# Patient Record
Sex: Male | Born: 1980 | Race: Black or African American | Hispanic: No | Marital: Married | Smoking: Current some day smoker
Health system: Southern US, Community
[De-identification: ages and names within clinical notes are randomized; demographics above are authoritative.]

---

## 2019-07-23 ENCOUNTER — Encounter: Payer: Self-pay | Admitting: Internal Medicine

## 2019-07-23 ENCOUNTER — Ambulatory Visit (INDEPENDENT_AMBULATORY_CARE_PROVIDER_SITE_OTHER): Payer: Self-pay | Admitting: Internal Medicine

## 2019-07-23 DIAGNOSIS — R61 Generalized hyperhidrosis: Secondary | ICD-10-CM

## 2019-07-23 DIAGNOSIS — Z7689 Persons encountering health services in other specified circumstances: Secondary | ICD-10-CM

## 2019-07-23 NOTE — Progress Notes (Signed)
Virtual Visit via Telephone Note  I connected with Cody Downs, on 07/23/2019 at 8:55 AM by telephone due to the COVID-19 pandemic and verified that I am speaking with the correct person using two identifiers.   Consent: I discussed the limitations, risks, security and privacy concerns of performing an evaluation and management service by telephone and the availability of in person appointments. I also discussed with the patient that there may be a patient responsible charge related to this service. The patient expressed understanding and agreed to proceed.   Location of Patient: Home   Location of Provider: Clinic    Persons participating in Telemedicine visit: Daren Jariel [facility] Dr. Earlene Plater      History of Present Illness: Patient has a visit to establish care.   Reports that he is waking up in cold sweats. This has been going on since mid Feb. Occurred for three-four weeks then stopped. Started again every once in a while. He has to change his clothes. Has lost 15 lbs since Dec. Had a negative COVID test at the end of Feb. Unsure if he has had fevers. Denies use of OTC or prescription medications. He smokes THC but denies other alcohol or drug use. Denies headaches, flushing, sensation of heart racing. Denies history of high BP. No recent travel outside of the country. Has not left state of [place] since Dec.     No past medical history on file. No Known Allergies  No current outpatient medications on file prior to visit.   No current facility-administered medications on file prior to visit.    Observations/Objective: NAD. Speaking clearly.  Work of breathing normal.  Alert and oriented. Mood appropriate.   Assessment and Plan: 1. Encounter to establish care  2. Night sweats With reported weight loss over past 3 months. Do not have any documented weights for objective data. Will obtain lab work to evaluate for TB, HIV, Hyperthyroidism, and any signs of  malignancy. Patient denies medication use and no illicit drug use that could potentially cause withdrawal symptoms.  No other symptoms concerning for pheochromocytoma or carcinoid syndrome. If lab work unremarkable, will plan to follow weight loss and severity of night sweats. Also needs physical exam particularly to evaluate for any LAD.  - C-reactive protein; Future - Urinalysis, Routine w reflex microscopic; Future - Sedimentation Rate; Future - CBC; Future - Comprehensive metabolic panel; Future - TSH; Future - HIV antibody (with reflex); Future - QuantiFERON-TB Gold Plus; Future   Follow Up Instructions: Lab visit 3/31   I discussed the assessment and treatment plan with the patient. The patient was provided an opportunity to ask questions and all were answered. The patient agreed with the plan and demonstrated an understanding of the instructions.   The patient was advised to call back or seek an in-person evaluation if the symptoms worsen or if the condition fails to improve as anticipated.     I provided 26 minutes total of non-face-to-face time during this encounter including median intraservice time, reviewing previous notes, investigations, ordering medications, medical decision making, coordinating care and patient verbalized understanding at the end of the visit.    Marcy Siren, D.O. Primary Care at [facility]  07/23/2019, 8:55 AM

## 2019-07-24 ENCOUNTER — Other Ambulatory Visit: Payer: Self-pay

## 2019-07-24 DIAGNOSIS — R61 Generalized hyperhidrosis: Secondary | ICD-10-CM

## 2019-07-24 NOTE — Progress Notes (Signed)
Patient here for labs ordered during phone visit.

## 2019-07-25 LAB — SEDIMENTATION RATE: Sed Rate: 93 mm/hr — ABNORMAL HIGH (ref 0–15)

## 2019-07-26 LAB — COMPREHENSIVE METABOLIC PANEL
ALT: 16 IU/L (ref 0–44)
AST: 17 IU/L (ref 0–40)
Albumin/Globulin Ratio: 1.7 (ref 1.2–2.2)
Albumin: 4.5 g/dL (ref 4.0–5.0)
Alkaline Phosphatase: 98 IU/L (ref 39–117)
BUN/Creatinine Ratio: 13 (ref 9–20)
BUN: 12 mg/dL (ref 6–20)
Bilirubin Total: 0.4 mg/dL (ref 0.0–1.2)
CO2: 22 mmol/L (ref 20–29)
Calcium: 9.5 mg/dL (ref 8.7–10.2)
Chloride: 99 mmol/L (ref 96–106)
Creatinine, Ser: 0.89 mg/dL (ref 0.76–1.27)
GFR calc Af Amer: 127 mL/min/{1.73_m2} (ref >59)
GFR calc non Af Amer: 110 mL/min/{1.73_m2} (ref >59)
Globulin, Total: 2.7 g/dL (ref 1.5–4.5)
Glucose: 78 mg/dL (ref 65–99)
Potassium: 3.8 mmol/L (ref 3.5–5.2)
Sodium: 137 mmol/L (ref 134–144)
Total Protein: 7.2 g/dL (ref 6.0–8.5)

## 2019-07-26 LAB — QUANTIFERON-TB GOLD PLUS
QuantiFERON Mitogen Value: >10 IU/mL
QuantiFERON Nil Value: 0.39 IU/mL
QuantiFERON TB1 Ag Value: 9.87 IU/mL
QuantiFERON TB2 Ag Value: >10 IU/mL
QuantiFERON-TB Gold Plus: POSITIVE — AB

## 2019-07-26 LAB — C-REACTIVE PROTEIN: CRP: 29 mg/L — ABNORMAL HIGH (ref 0–10)

## 2019-07-26 LAB — TSH: TSH: 0.525 u[IU]/mL (ref 0.450–4.500)

## 2019-07-26 LAB — HIV ANTIBODY (ROUTINE TESTING W REFLEX): HIV Screen 4th Generation wRfx: NONREACTIVE

## 2019-07-30 ENCOUNTER — Encounter: Payer: Self-pay | Admitting: Internal Medicine

## 2019-07-30 DIAGNOSIS — R7612 Nonspecific reaction to cell mediated immunity measurement of gamma interferon antigen response without active tuberculosis: Secondary | ICD-10-CM | POA: Insufficient documentation

## 2019-07-30 NOTE — Progress Notes (Signed)
Patient notified of results & recommendations. Expressed understanding. Aware that he will be contacted by the Health Department to schedule his chest xray & sputum collection. Faxed office notes, labs & demographics to the TB department at [facility])

## 2020-05-29 ENCOUNTER — Other Ambulatory Visit: Payer: Self-pay

## 2020-05-29 ENCOUNTER — Encounter (HOSPITAL_COMMUNITY): Payer: Self-pay | Admitting: Emergency Medicine

## 2020-05-29 ENCOUNTER — Emergency Department (HOSPITAL_COMMUNITY)
Admission: EM | Admit: 2020-05-29 | Discharge: 2020-05-29 | Disposition: A | Discharge status: Home / Self Care | Payer: HRSA Program | Attending: Emergency Medicine | Admitting: Emergency Medicine

## 2020-05-29 ENCOUNTER — Emergency Department (HOSPITAL_COMMUNITY): Payer: HRSA Program

## 2020-05-29 DIAGNOSIS — R112 Nausea with vomiting, unspecified: Secondary | ICD-10-CM | POA: Diagnosis present

## 2020-05-29 DIAGNOSIS — U071 COVID-19: Secondary | ICD-10-CM | POA: Diagnosis not present

## 2020-05-29 DIAGNOSIS — F17200 Nicotine dependence, unspecified, uncomplicated: Secondary | ICD-10-CM | POA: Diagnosis not present

## 2020-05-29 DIAGNOSIS — R079 Chest pain, unspecified: Secondary | ICD-10-CM | POA: Insufficient documentation

## 2020-05-29 DIAGNOSIS — R1010 Upper abdominal pain, unspecified: Secondary | ICD-10-CM | POA: Insufficient documentation

## 2020-05-29 DIAGNOSIS — R7401 Elevation of levels of liver transaminase levels: Secondary | ICD-10-CM | POA: Diagnosis not present

## 2020-05-29 LAB — URINALYSIS, ROUTINE W REFLEX MICROSCOPIC
Bacteria, UA: NONE SEEN
Bilirubin Urine: NEGATIVE
Glucose, UA: NEGATIVE mg/dL
Hgb urine dipstick: NEGATIVE
Ketones, ur: NEGATIVE mg/dL
Leukocytes,Ua: NEGATIVE
Nitrite: NEGATIVE
Protein, ur: 30 mg/dL — AB
Specific Gravity, Urine: 1.032 — ABNORMAL HIGH (ref 1.005–1.030)
pH: 8 (ref 5.0–8.0)

## 2020-05-29 LAB — COMPREHENSIVE METABOLIC PANEL
ALT: 93 U/L — ABNORMAL HIGH (ref 0–44)
AST: 61 U/L — ABNORMAL HIGH (ref 15–41)
Albumin: 4 g/dL (ref 3.5–5.0)
Alkaline Phosphatase: 64 U/L (ref 38–126)
Anion gap: 13 (ref 5–15)
BUN: 10 mg/dL (ref 6–20)
CO2: 21 mmol/L — ABNORMAL LOW (ref 22–32)
Calcium: 9.3 mg/dL (ref 8.9–10.3)
Chloride: 100 mmol/L (ref 98–111)
Creatinine, Ser: 1.01 mg/dL (ref 0.61–1.24)
GFR, Estimated: >60 mL/min (ref >60)
Glucose, Bld: 151 mg/dL — ABNORMAL HIGH (ref 70–99)
Potassium: 3.7 mmol/L (ref 3.5–5.1)
Sodium: 134 mmol/L — ABNORMAL LOW (ref 135–145)
Total Bilirubin: 0.6 mg/dL (ref 0.3–1.2)
Total Protein: 7 g/dL (ref 6.5–8.1)

## 2020-05-29 LAB — SARS CORONAVIRUS 2 (TAT 6-24 HRS): SARS Coronavirus 2: POSITIVE — AB

## 2020-05-29 LAB — CBC
HCT: 44.2 % (ref 39.0–52.0)
Hemoglobin: 13.7 g/dL (ref 13.0–17.0)
MCH: 23.3 pg — ABNORMAL LOW (ref 26.0–34.0)
MCHC: 31 g/dL (ref 30.0–36.0)
MCV: 75.3 fL — ABNORMAL LOW (ref 80.0–100.0)
Platelets: 279 10*3/uL (ref 150–400)
RBC: 5.87 MIL/uL — ABNORMAL HIGH (ref 4.22–5.81)
RDW: 14.4 % (ref 11.5–15.5)
WBC: 12.6 10*3/uL — ABNORMAL HIGH (ref 4.0–10.5)
nRBC: 0 % (ref 0.0–0.2)

## 2020-05-29 LAB — LIPASE, BLOOD: Lipase: 25 U/L (ref 11–51)

## 2020-05-29 LAB — TROPONIN I (HIGH SENSITIVITY)
Troponin I (High Sensitivity): 3 ng/L (ref <18)
Troponin I (High Sensitivity): 3 ng/L (ref <18)

## 2020-05-29 MED ORDER — DIPHENHYDRAMINE HCL 50 MG/ML IJ SOLN
25.0000 mg | Freq: Once | INTRAMUSCULAR | Status: AC
  Administered 2020-05-29: 25 mg via INTRAVENOUS
  Filled 2020-05-29: qty 1

## 2020-05-29 MED ORDER — PROCHLORPERAZINE EDISYLATE 10 MG/2ML IJ SOLN
10.0000 mg | Freq: Once | INTRAMUSCULAR | Status: AC
  Administered 2020-05-29: 10 mg via INTRAVENOUS
  Filled 2020-05-29: qty 2

## 2020-05-29 MED ORDER — ONDANSETRON 4 MG PO TBDP
4.0000 mg | ORAL_TABLET | Freq: Once | ORAL | Status: AC | PRN
  Administered 2020-05-29: 4 mg via ORAL
  Filled 2020-05-29: qty 1

## 2020-05-29 MED ORDER — SODIUM CHLORIDE 0.9 % IV BOLUS
1000.0000 mL | Freq: Once | INTRAVENOUS | Status: AC
  Administered 2020-05-29: 1000 mL via INTRAVENOUS

## 2020-05-29 MED ORDER — ALUM & MAG HYDROXIDE-SIMETH 200-200-20 MG/5ML PO SUSP
30.0000 mL | Freq: Once | ORAL | Status: AC
  Administered 2020-05-29: 30 mL via ORAL
  Filled 2020-05-29: qty 30

## 2020-05-29 MED ORDER — ONDANSETRON 4 MG PO TBDP: ORAL_TABLET | ORAL | 0 refills | Status: AC

## 2020-05-29 MED ORDER — ACETAMINOPHEN 500 MG PO TABS
1000.0000 mg | ORAL_TABLET | Freq: Once | ORAL | Status: AC
  Administered 2020-05-29: 1000 mg via ORAL
  Filled 2020-05-29: qty 2

## 2020-05-29 NOTE — ED Provider Notes (Signed)
[facility] EMERGENCY DEPARTMENT Provider Note   CSN: [id number] Arrival date & time: 05/29/20  0354     History Chief Complaint  Patient presents with  . Emesis    Cody Downs is a 40 y.o. male.  40 yo M with a chief complaints of nausea and vomiting.  This started about 5 hours ago.  Has not been able to eat or drink anything per the patient.  He was throwing up some brown looking material that he thinks might have been blood.  Developed some upper abdominal pain with vomiting.  No fevers or chills.  No diarrhea.  No sick contacts.  No suspicious food intake.  No recent travel.  He is concerned that he might have the coronavirus.  Denies any cough or congestion.  The history is provided by the patient.  Emesis Severity:  Moderate Duration:  5 hours Timing:  Constant Progression:  Worsening Chronicity:  New Recent urination:  Normal Relieved by:  Nothing Worsened by:  Nothing Ineffective treatments:  None tried Associated symptoms: abdominal pain   Associated symptoms: no arthralgias, no chills, no diarrhea, no fever, no headaches and no myalgias        History reviewed. No pertinent past medical history.  Patient Active Problem List   Diagnosis Date Noted  . 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 No pertinent surgical history.     No family history on file.  Social History   Tobacco Use  . Smoking status: Current Some Day Smoker  . Smokeless tobacco: Never Used  Substance Use Topics  . Alcohol use: Never  . Drug use: Never    Home Medications Prior to Admission medications   Medication Sig Start Date End Date Taking? Authorizing Provider  ondansetron (ZOFRAN ODT) 4 MG disintegrating tablet 4mg  ODT q4 hours prn nausea/vomit 05/29/20  Yes 07/27/20, DO    Allergies    Patient has no known allergies.  Review of Systems   Review of Systems  Constitutional: Negative for chills and fever.  HENT: Negative for  congestion and facial swelling.   Eyes: Negative for discharge and visual disturbance.  Respiratory: Negative for shortness of breath.   Cardiovascular: Negative for chest pain and palpitations.  Gastrointestinal: Positive for abdominal pain, nausea and vomiting. Negative for diarrhea.  Musculoskeletal: Negative for arthralgias and myalgias.  Skin: Negative for color change and rash.  Neurological: Negative for tremors, syncope and headaches.  Psychiatric/Behavioral: Negative for confusion and dysphoric mood.    Physical Exam Updated Vital Signs BP 116/65 (BP Location: Right Arm)   Pulse 89   Temp 100 F (37.8 C) (Oral)   Resp (!) 29   Ht 5\' 10"  (1.778 m)   Wt 82 kg   SpO2 98%   BMI 25.94 kg/m   Physical Exam Vitals and nursing note reviewed.  Constitutional:      Appearance: He is well-developed and well-nourished.  HENT:     Head: Normocephalic and atraumatic.  Eyes:     Extraocular Movements: EOM normal.     Pupils: Pupils are equal, round, and reactive to light.  Neck:     Vascular: No JVD.  Cardiovascular:     Rate and Rhythm: Normal rate and regular rhythm.     Heart sounds: No murmur heard. No friction rub. No gallop.   Pulmonary:     Effort: No respiratory distress.     Breath sounds: No wheezing.  Abdominal:     General: There  is no distension.     Tenderness: There is no abdominal tenderness. There is no guarding or rebound.     Comments: Benign abdominal exam  Musculoskeletal:        General: Normal range of motion.     Cervical back: Normal range of motion and neck supple.  Skin:    Coloration: Skin is not pale.     Findings: No rash.  Neurological:     Mental Status: He is alert and oriented to person, place, and time.  Psychiatric:        Mood and Affect: Mood and affect normal.        Behavior: Behavior normal.     ED Results / Procedures / Treatments   Labs (all labs ordered are listed, but only abnormal results are displayed) Labs  Reviewed  COMPREHENSIVE METABOLIC PANEL - Abnormal; Notable for the following components:      Result Value   Sodium 134 (*)    CO2 21 (*)    Glucose, Bld 151 (*)    AST 61 (*)    ALT 93 (*)    All other components within normal limits  CBC - Abnormal; Notable for the following components:   WBC 12.6 (*)    RBC 5.87 (*)    MCV 75.3 (*)    MCH 23.3 (*)    All other components within normal limits  URINALYSIS, ROUTINE W REFLEX MICROSCOPIC - Abnormal; Notable for the following components:   Specific Gravity, Urine 1.032 (*)    Protein, ur 30 (*)    All other components within normal limits  SARS CORONAVIRUS 2 (TAT 6-24 HRS)  LIPASE, BLOOD  TROPONIN I (HIGH SENSITIVITY)  TROPONIN I (HIGH SENSITIVITY)    EKG EKG Interpretation  Date/Time:  Friday May 29 2020 05:02:28 EST Ventricular Rate:  95 PR Interval:  128 QRS Duration: 74 QT Interval:  320 QTC Calculation: 402 R Axis:   67 Text Interpretation: Normal sinus rhythm Normal ECG No old tracing to compare Confirmed by Melene Plan [phone]) on 05/29/2020 7:07:26 AM   Radiology DG Chest 2 View  Result Date: 05/29/2020 CLINICAL DATA:  Chest pain EXAM: CHEST - 2 VIEW COMPARISON:  None FINDINGS: No consolidation, features of edema, pneumothorax, or effusion. Pulmonary vascularity is normally distributed. The cardiomediastinal contours are unremarkable. No acute osseous or soft tissue abnormality. IMPRESSION: No acute cardiopulmonary abnormality. Electronically Signed   By: Kreg Shropshire M.D.   On: 05/29/2020 05:26    Procedures Procedures   Medications Ordered in ED Medications  ondansetron (ZOFRAN-ODT) disintegrating tablet 4 mg (4 mg Oral Given 05/29/20 0407)  sodium chloride 0.9 % bolus 1,000 mL (1,000 mLs Intravenous New Bag/Given 05/29/20 0846)  prochlorperazine (COMPAZINE) injection 10 mg (10 mg Intravenous Given 05/29/20 0846)  diphenhydrAMINE (BENADRYL) injection 25 mg (25 mg Intravenous Given 05/29/20 0847)  alum & mag  hydroxide-simeth (MAALOX/MYLANTA) 200-200-20 MG/5ML suspension 30 mL (30 mLs Oral Given 05/29/20 0846)  acetaminophen (TYLENOL) tablet 1,000 mg (1,000 mg Oral Given 05/29/20 0900)    ED Course  I have reviewed the triage vital signs and the nursing notes.  Pertinent labs & imaging results that were available during my care of the patient were reviewed by me and considered in my medical decision making (see chart for details).    MDM Rules/Calculators/A&P                          40 yo M with a  chief complaints of nausea and vomiting.  Going on for about 5 hours now.  Patient without any significant abdominal pain on exam.  Very mild LFT elevation.  Lipase is normal.  He is complaining of some chest pain and so troponin was ordered in triage which is negative.  EKG without ischemic findings as viewed by me.  Chest x-ray viewed by me without focal infiltrate or pneumothorax.  Doubt Boerhaave's based on history and exam.  We will treat patient's pain and nausea.  Bolus of IV fluids.  Reassess.  Tolerating PO.  Feeling better.  D/c home.   10:40 AM:  I have discussed the diagnosis/risks/treatment options with the patient and believe the pt to be eligible for discharge home to follow-up with PCP. We also discussed returning to the ED immediately if new or worsening sx occur. We discussed the sx which are most concerning (e.g., sudden worsening pain, fever, inability to tolerate by mouth) that necessitate immediate return. Medications administered to the patient during their visit and any new prescriptions provided to the patient are listed below.  Medications given during this visit Medications  ondansetron (ZOFRAN-ODT) disintegrating tablet 4 mg (4 mg Oral Given 05/29/20 0407)  sodium chloride 0.9 % bolus 1,000 mL (1,000 mLs Intravenous New Bag/Given 05/29/20 0846)  prochlorperazine (COMPAZINE) injection 10 mg (10 mg Intravenous Given 05/29/20 0846)  diphenhydrAMINE (BENADRYL) injection 25 mg (25 mg  Intravenous Given 05/29/20 0847)  alum & mag hydroxide-simeth (MAALOX/MYLANTA) 200-200-20 MG/5ML suspension 30 mL (30 mLs Oral Given 05/29/20 0846)  acetaminophen (TYLENOL) tablet 1,000 mg (1,000 mg Oral Given 05/29/20 0900)     The patient appears reasonably screen and/or stabilized for discharge and I doubt any other medical condition or other [facility] requiring further screening, evaluation, or treatment in the ED at this time prior to discharge.   Final Clinical Impression(s) / ED Diagnoses Final diagnoses:  Nausea and vomiting in adult    Rx / DC Orders ED Discharge Orders         Ordered    ondansetron (ZOFRAN ODT) 4 MG disintegrating tablet        05/29/20 1034           Melene Plan, DO 05/29/20 1040

## 2020-05-29 NOTE — ED Triage Notes (Signed)
Patient reports chest pain while sitting at waiting area , EKG /Troponin/CXR ordered.Respirations unlabored.

## 2020-05-29 NOTE — Discharge Instructions (Signed)
Follow up with your doctor in the office.  I have sent off a Covid test.  This should resolved sometime in the next 6 to 24 hours.  He can check this on MyChart.  Please return for worsening trouble breathing inability to eat or drink.  I prescribed you nausea medicine.

## 2020-05-29 NOTE — ED Triage Notes (Signed)
Patient reports multiple emesis this morning , no abdominal pain or diarrhea , denies fever or chills .

## 2020-06-01 ENCOUNTER — Telehealth: Payer: Self-pay | Admitting: *Deleted

## 2020-06-01 NOTE — Telephone Encounter (Signed)
Transition Care Management Follow-up Telephone Call  Date of discharge and from where: 05/29/2020 - [facility]  How have you been since you were released from the hospital? "I am feeling much better."  Any questions or concerns? No  Items Reviewed:  Did the pt receive and understand the discharge instructions provided? Yes   Medications obtained and verified? Yes   Other? N/A  Any new allergies since your discharge? No   Dietary orders reviewed? Yes  Do you have support at home? Yes   Home Care and Equipment/Supplies: Were home health services ordered? not applicable If so, what is the name of the agency? N/A  Has the agency set up a time to come to the patient's home? not applicable Were any new equipment or medical supplies ordered?  No What is the name of the medical supply agency? N/A Were you able to get the supplies/equipment? not applicable Do you have any questions related to the use of the equipment or supplies? No  Functional Questionnaire: (I = Independent and D = Dependent) ADLs: I  Bathing/Dressing- I  Meal Prep- I  Eating- I  Maintaining continence- I  Transferring/Ambulation- I  Managing Meds- I  Follow up appointments reviewed:   PCP Hospital f/u appt confirmed? N/A   Specialist Hospital f/u appt confirmed? N/A   Are transportation arrangements needed? No   If their condition worsens, is the pt aware to call PCP or go to the Emergency Dept.? Yes  Was the patient provided with contact information for the PCP's office or ED? Yes  Was to pt encouraged to call back with questions or concerns? Yes

## 2022-06-01 IMAGING — CR DG CHEST 2V
2 series · 2 of 2 positions shown · non-contrast
Comparison: None

CLINICAL DATA: Chest pain

EXAM:
CHEST - 2 VIEW

[chest pa]
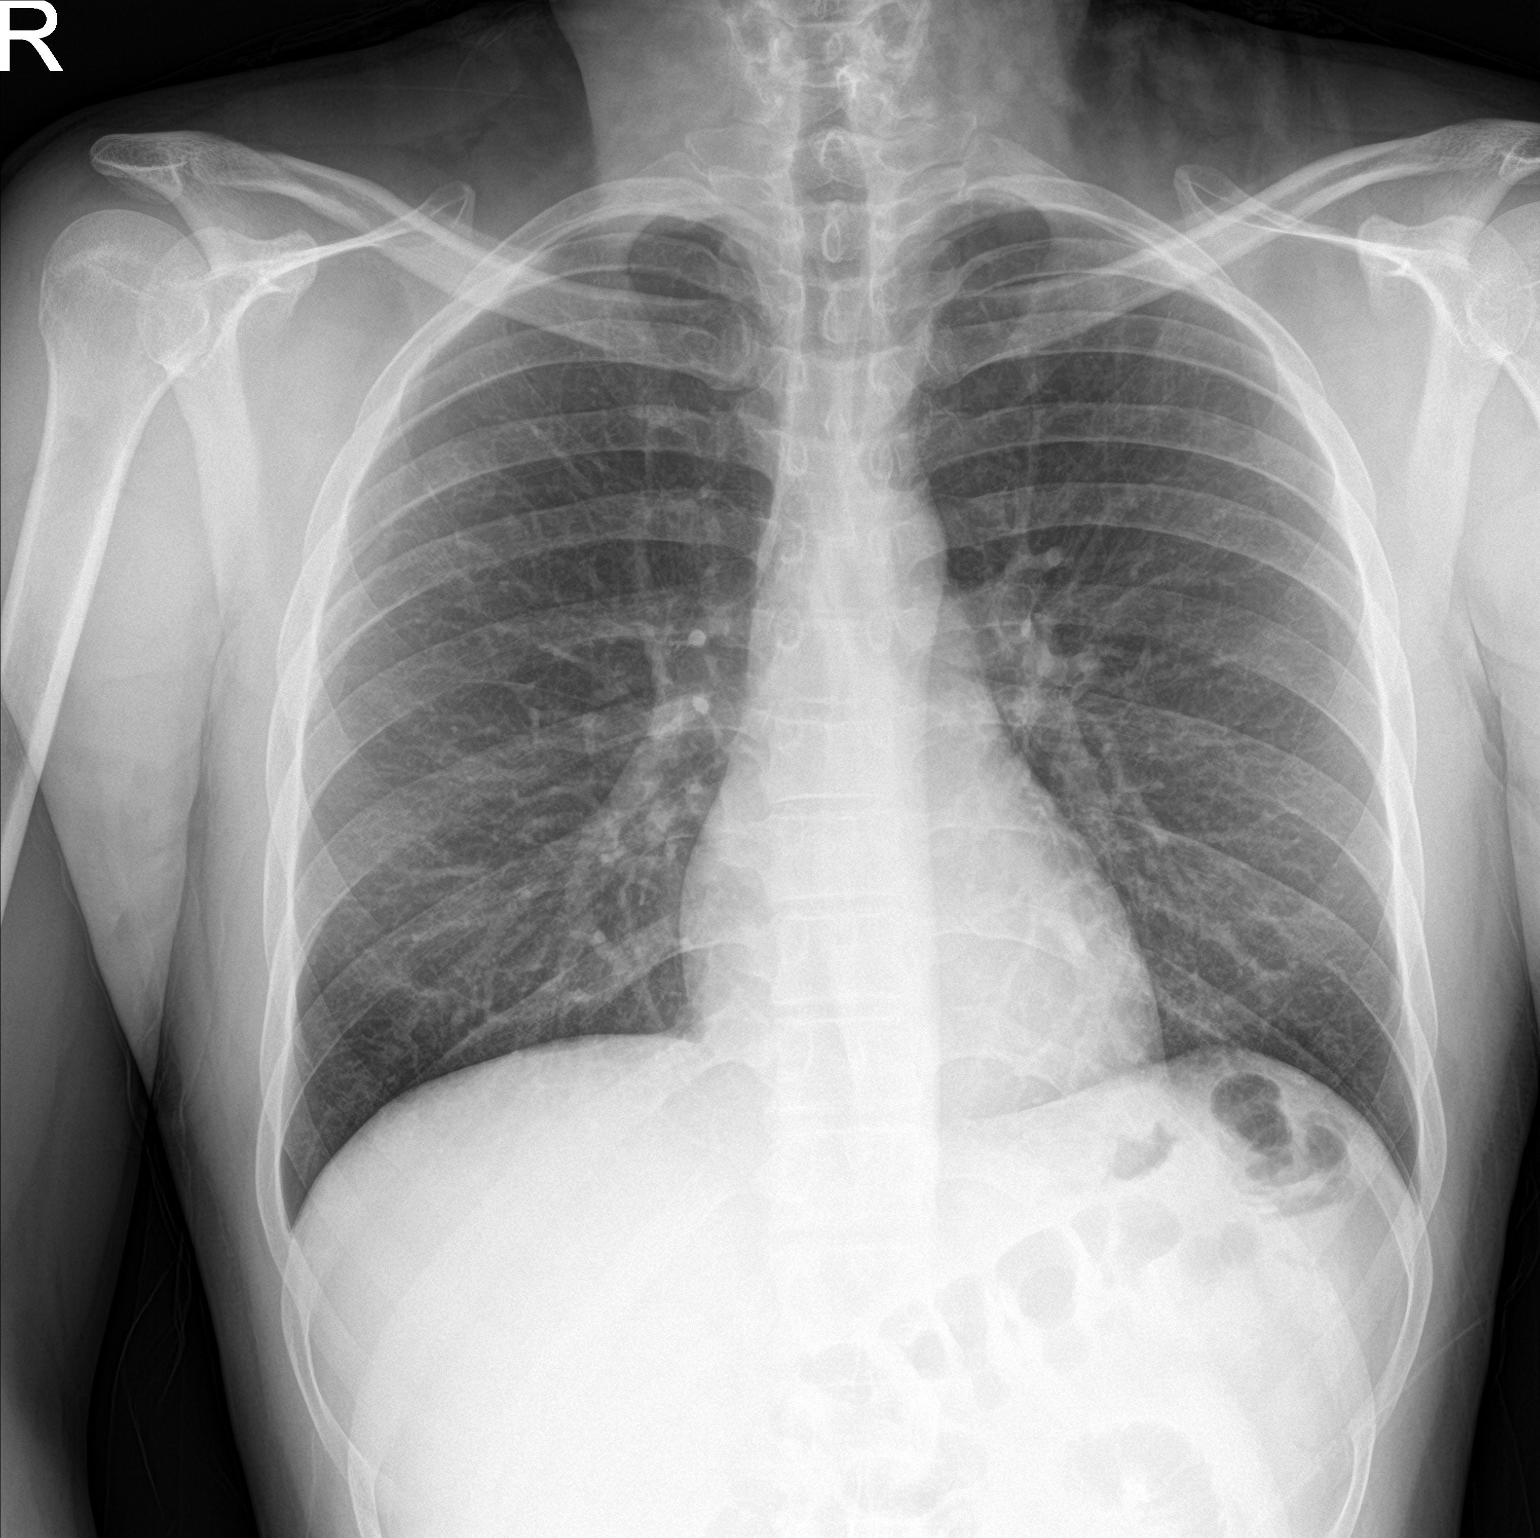

[chest lat]
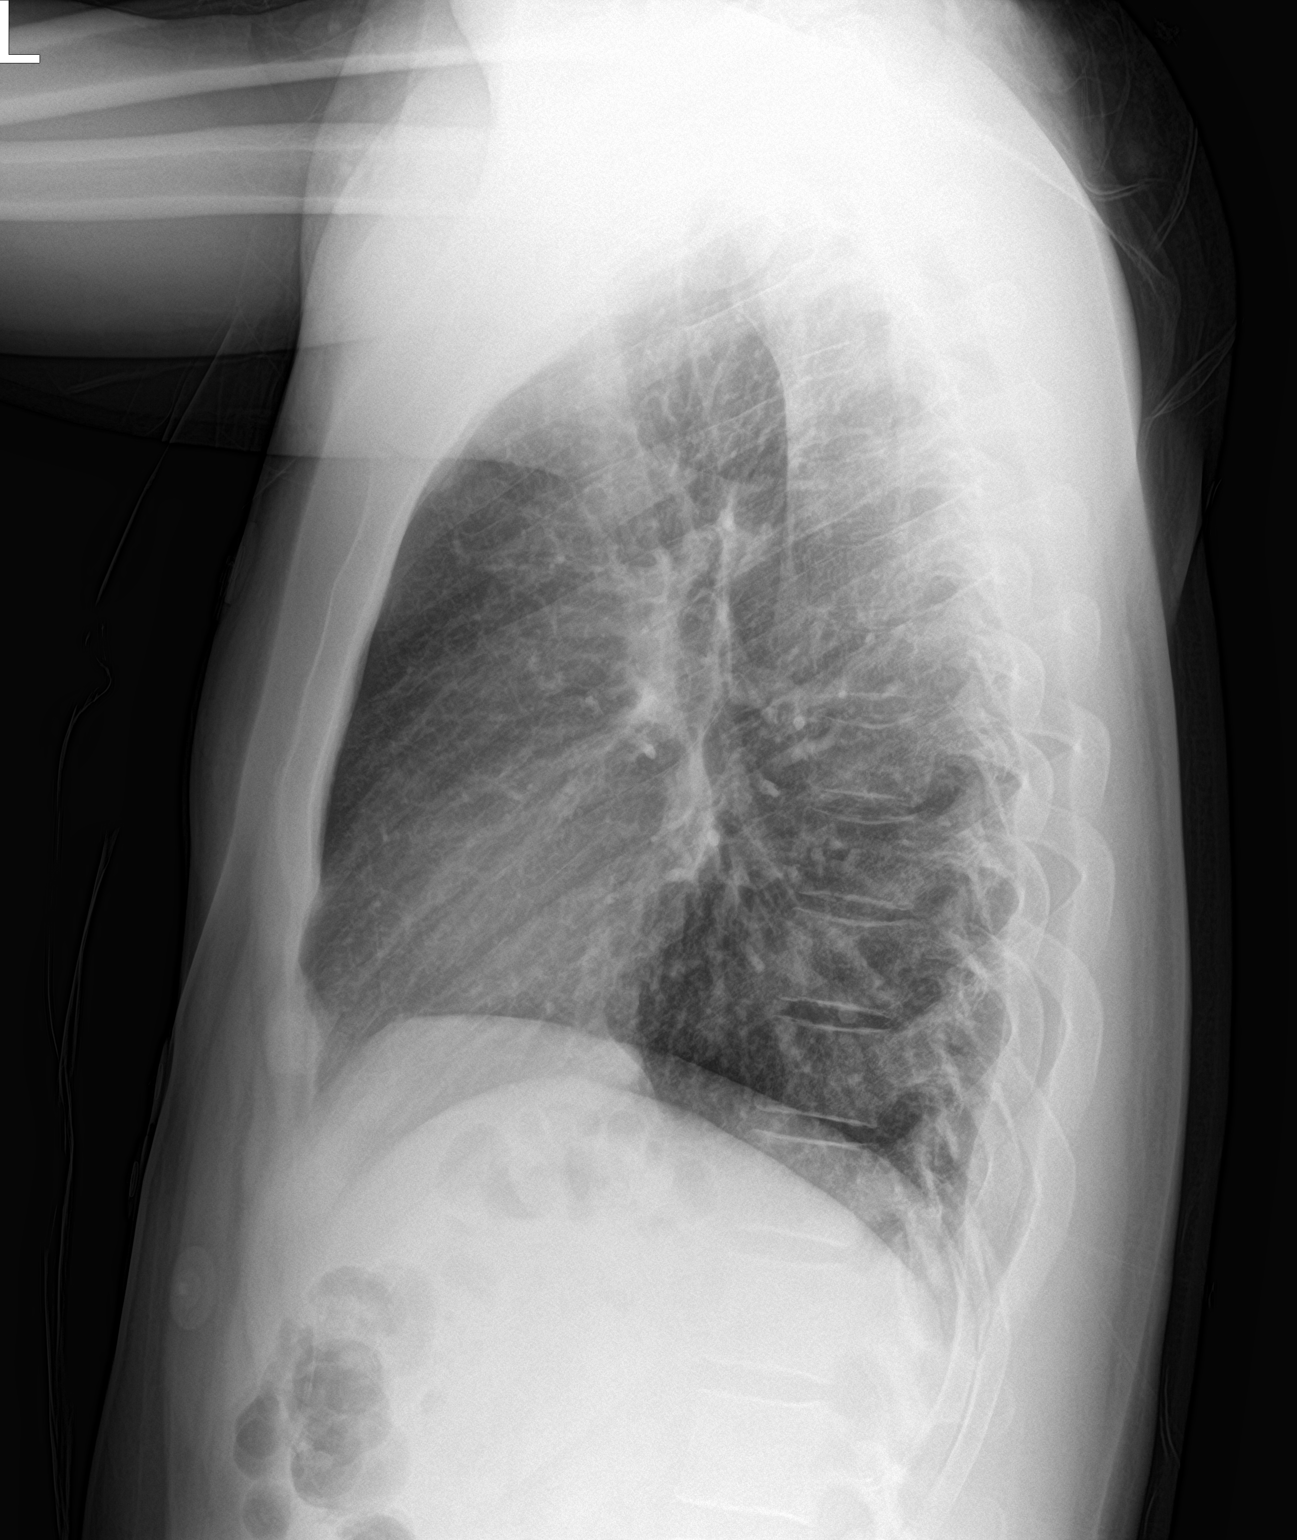

[2 of 2 positions shown; findings below may reference images not displayed]

FINDINGS: No consolidation, features of edema, pneumothorax, or effusion.
Pulmonary vascularity is normally distributed. The cardiomediastinal
contours are unremarkable. No acute osseous or soft tissue
abnormality.
IMPRESSION: No acute cardiopulmonary abnormality.

## 2024-03-13 ENCOUNTER — Emergency Department (HOSPITAL_COMMUNITY)
Admission: EM | Admit: 2024-03-13 | Discharge: 2024-03-13 | Disposition: A | Discharge status: Home / Self Care | Attending: Emergency Medicine | Admitting: Emergency Medicine

## 2024-03-13 ENCOUNTER — Other Ambulatory Visit: Payer: Self-pay

## 2024-03-13 ENCOUNTER — Encounter (HOSPITAL_COMMUNITY): Payer: Self-pay

## 2024-03-13 ENCOUNTER — Emergency Department (HOSPITAL_COMMUNITY)

## 2024-03-13 DIAGNOSIS — F17290 Nicotine dependence, other tobacco product, uncomplicated: Secondary | ICD-10-CM | POA: Diagnosis not present

## 2024-03-13 DIAGNOSIS — M545 Low back pain, unspecified: Secondary | ICD-10-CM | POA: Insufficient documentation

## 2024-03-13 LAB — URINALYSIS, ROUTINE W REFLEX MICROSCOPIC
Bilirubin Urine: NEGATIVE
Glucose, UA: NEGATIVE mg/dL
Hgb urine dipstick: NEGATIVE
Ketones, ur: NEGATIVE mg/dL
Leukocytes,Ua: NEGATIVE
Nitrite: NEGATIVE
Protein, ur: NEGATIVE mg/dL
Specific Gravity, Urine: 1.02 (ref 1.005–1.030)
pH: 8 (ref 5.0–8.0)

## 2024-03-13 MED ORDER — KETOROLAC TROMETHAMINE 60 MG/2ML IM SOLN
30.0000 mg | Freq: Once | INTRAMUSCULAR | Status: AC
  Administered 2024-03-13: 30 mg via INTRAMUSCULAR
  Filled 2024-03-13: qty 2

## 2024-03-13 MED ORDER — METHYLPREDNISOLONE 4 MG PO TBPK: ORAL_TABLET | ORAL | 0 refills | Status: AC

## 2024-03-13 MED ORDER — ACETAMINOPHEN 500 MG PO TABS
1000.0000 mg | ORAL_TABLET | Freq: Once | ORAL | Status: AC
  Administered 2024-03-13: 1000 mg via ORAL
  Filled 2024-03-13: qty 2

## 2024-03-13 NOTE — ED Triage Notes (Signed)
 PT arrives via POV. PT c/o lower back pain since Sunday. Denies injury. Pt worsens with movement. PT AxOx4.

## 2024-03-13 NOTE — Discharge Instructions (Addendum)
 While you were in the emergency room, you have an x-ray done of your back that was normal.  You had a urinalysis done which did not show any signs of infection or blood in your urine.  I have sent your prescription for a steroid pack called Medrol.  Please take it as instructed by the dose packaging.  Do not take any additional NSAIDs such as Motrin, Aleve or ibuprofen while you are on this.  You may also take 1000 mg of Tylenol  every 8 hours.  Follow-up with your primary care doctor.

## 2024-03-13 NOTE — ED Provider Notes (Signed)
 [place] EMERGENCY DEPARTMENT AT [facility] Provider Note  CSN: [id number] Arrival date & time: 03/13/24 0750  Chief Complaint(s) Back Pain  HPI Cody Downs is a 43 y.o. male who is here today for back pain.  Patient endorses that his pain started on the left side of his lower back beginning on Sunday.  Patient states that he does not recall a specific injury, but does work in a warehouse.  He has had issues with his back previously, but has not had issues over the last 5 months.  He does not have a history of diabetes, denies any IV drug use.   Past Medical History History reviewed. No pertinent past medical history. 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 Authorizing Provider  methylPREDNISolone (MEDROL DOSEPAK) 4 MG TBPK tablet Take as instructed by dose packaging 03/13/24  Yes Mannie Pac T, DO  ondansetron  (ZOFRAN  ODT) 4 MG disintegrating tablet 4mg  ODT q4 hours prn nausea/vomit 05/29/20   Floyd, Dan, DO                                                                                                                                    Past Surgical History History reviewed. No pertinent surgical history. Family History History reviewed. No pertinent family history.  Social History Social History   Tobacco Use   Smoking status: Some Days   Smokeless tobacco: Never  Substance Use Topics   Alcohol use: Never   Drug use: Never   Allergies Patient has no known allergies.  Review of Systems Review of Systems  Physical Exam Vital Signs  I have reviewed the triage vital signs BP 126/89   Pulse 60   Temp 98.3 F (36.8 C)   Resp 16   SpO2 100%   Physical Exam Vitals and nursing note reviewed.  Eyes:     Pupils: Pupils are equal, round, and reactive to light.  Pulmonary:     Effort: Pulmonary  effort is normal.  Abdominal:     General: Abdomen is flat.     Palpations: Abdomen is soft.  Musculoskeletal:     Cervical back: Normal range of motion.     Comments: No midline spinous process tenderness, left lower lumbar paraspinal muscle tenderness.  Neurological:     General: No focal deficit present.     Mental Status: He is alert.     Comments: 5/5 strength with plantar and dorsi flexion.  5-5 straight leg raise bilaterally.  Intact sensation of bilateral lower extremities, no saddle anesthesia.     ED Results and Treatments Labs (all labs ordered are listed, but only abnormal results are displayed) Labs Reviewed  URINALYSIS, ROUTINE W REFLEX MICROSCOPIC  Radiology DG Lumbar Spine Complete Result Date: 03/13/2024 CLINICAL DATA:  Low back pain EXAM: LUMBAR SPINE - COMPLETE 4+ VIEW COMPARISON:  None Available. FINDINGS: Five lumbar type vertebra. There is no acute fracture or subluxation of the lumbar spine. The vertebral body heights and disc spaces are maintained. The visualized posterior elements are intact the soft tissues are unremarkable. IMPRESSION: Negative. Electronically Signed   By: Vanetta Chou M.D.   On: 03/13/2024 08:52    Pertinent labs & imaging results that were available during my care of the patient were reviewed by me and considered in my medical decision making (see MDM for details).  Medications Ordered in ED Medications  ketorolac  (TORADOL ) injection 30 mg (30 mg Intramuscular Given 03/13/24 0847)  acetaminophen  (TYLENOL ) tablet 1,000 mg (1,000 mg Oral Given 03/13/24 0846)                                                                                                                                     Procedures Procedures  (including critical care time)  Medical Decision Making / ED Course   This patient presents to the  ED for concern of back pain, this involves an extensive number of treatment options, and is a complaint that carries with it a high risk of complications and morbidity.  The differential diagnosis includes musculoskeletal back pain, consider malignancy, less likely infectious process, less likely fracture.  MDM: On exam, patient well-appearing.  He has normal vital signs.  His physical exam is overall reassuring.  I do not appreciate any neurologic dysfunction.  Patient was able to ambulate into the emergency room.  I did remind Waylon, obtain some plain films on the patient.  Will provide him with some Toradol , Tylenol .  Will check a UA.  Reassessment 11:25 AM-patient's x-ray is negative.  His UA does not show any blood or evidence of infection.  He is appropriate for discharge.  Medrol  Dosepak sent to pharmacy.  Additional history obtained: -Additional history obtained from  -External records from outside source obtained and reviewed including: Chart review including previous notes, labs, imaging, consultation notes   Lab Tests: -I ordered, reviewed, and interpreted labs.   The pertinent results include:   Labs Reviewed  URINALYSIS, ROUTINE W REFLEX MICROSCOPIC       Imaging Studies ordered: I ordered imaging studies including plain films of the back I independently visualized and interpreted imaging. I agree with the radiologist interpretation   Medicines ordered and prescription drug management: Meds ordered this encounter  Medications   ketorolac  (TORADOL ) injection 30 mg   acetaminophen  (TYLENOL ) tablet 1,000 mg   methylPREDNISolone  (MEDROL  DOSEPAK) 4 MG TBPK tablet    Sig: Take as instructed by dose packaging    Dispense:  1 each    Refill:  0    -I have reviewed the patients home medicines and have made adjustments as needed  Cardiac Monitoring: The patient was maintained on  a cardiac monitor.  I personally viewed and interpreted the cardiac monitored which showed  an underlying rhythm of: Normal sinus rhythm  Social Determinants of Health:  Factors impacting patients care include: Access to primary care   Reevaluation: After the interventions noted above, I reevaluated the patient and found that they have :improved  Co morbidities that complicate the patient evaluation History reviewed. No pertinent past medical history.    Dispostion: I considered admission for this patient, however he is appropriate for discharge.     Final Clinical Impression(s) / ED Diagnoses Final diagnoses:  Acute left-sided low back pain without sciatica     @PCDICTATION @    Mannie Pac T, DO 03/13/24 1128
